# Patient Record
Sex: Female | Born: 1980
Health system: Southern US, Community
[De-identification: ages and names within clinical notes are randomized; demographics above are authoritative.]

## PROBLEM LIST (undated history)

## (undated) DIAGNOSIS — F329 Major depressive disorder, single episode, unspecified: Secondary | ICD-10-CM

## (undated) DIAGNOSIS — F32A Depression, unspecified: Secondary | ICD-10-CM

## (undated) DIAGNOSIS — R519 Headache, unspecified: Secondary | ICD-10-CM

## (undated) DIAGNOSIS — T7840XA Allergy, unspecified, initial encounter: Secondary | ICD-10-CM

## (undated) DIAGNOSIS — R51 Headache: Secondary | ICD-10-CM

## (undated) HISTORY — DX: Depression, unspecified: F32.A

## (undated) HISTORY — DX: Headache, unspecified: R51.9

## (undated) HISTORY — DX: Headache: R51

## (undated) HISTORY — DX: Major depressive disorder, single episode, unspecified: F32.9

## (undated) HISTORY — DX: Allergy, unspecified, initial encounter: T78.40XA

---
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
## 1999-02-13 ENCOUNTER — Inpatient Hospital Stay (HOSPITAL_COMMUNITY): Admission: AD | Admit: 1999-02-13 | Discharge: 1999-02-13 | Payer: Self-pay | Admitting: Obstetrics

## 1999-02-20 ENCOUNTER — Inpatient Hospital Stay (HOSPITAL_COMMUNITY): Admission: AD | Admit: 1999-02-20 | Discharge: 1999-02-20 | Payer: Self-pay | Admitting: Obstetrics

## 1999-06-15 ENCOUNTER — Inpatient Hospital Stay (HOSPITAL_COMMUNITY): Admission: AD | Admit: 1999-06-15 | Discharge: 1999-06-15 | Payer: Self-pay | Admitting: *Deleted

## 1999-12-05 ENCOUNTER — Encounter: Payer: Self-pay | Admitting: Emergency Medicine

## 1999-12-05 ENCOUNTER — Emergency Department (HOSPITAL_COMMUNITY): Admission: EM | Admit: 1999-12-05 | Discharge: 1999-12-05 | Payer: Self-pay | Admitting: Emergency Medicine

## 2000-04-28 ENCOUNTER — Emergency Department (HOSPITAL_COMMUNITY): Admission: EM | Admit: 2000-04-28 | Discharge: 2000-04-28 | Payer: Self-pay | Admitting: Emergency Medicine

## 2000-08-17 ENCOUNTER — Inpatient Hospital Stay (HOSPITAL_COMMUNITY): Admission: AD | Admit: 2000-08-17 | Discharge: 2000-08-17 | Payer: Self-pay | Admitting: Obstetrics

## 2001-09-22 ENCOUNTER — Inpatient Hospital Stay (HOSPITAL_COMMUNITY): Admission: AD | Admit: 2001-09-22 | Discharge: 2001-09-22 | Payer: Self-pay | Admitting: *Deleted

## 2003-04-29 ENCOUNTER — Emergency Department (HOSPITAL_COMMUNITY): Admission: EM | Admit: 2003-04-29 | Discharge: 2003-04-29 | Payer: Self-pay

## 2004-06-09 ENCOUNTER — Emergency Department (HOSPITAL_COMMUNITY): Admission: EM | Admit: 2004-06-09 | Discharge: 2004-06-09 | Payer: Self-pay | Admitting: Emergency Medicine

## 2005-01-14 ENCOUNTER — Inpatient Hospital Stay (HOSPITAL_COMMUNITY): Admission: AD | Admit: 2005-01-14 | Discharge: 2005-01-14 | Payer: Self-pay | Admitting: *Deleted

## 2006-09-07 IMAGING — US US OB COMP LESS 14 WK
1 series · 18 of 28 positions shown · non-contrast
Comparison: none

CLINICAL DATA: Early pregnancy.  Uncertain menstrual dates.  Bleeding.  Nausea and syncope.  
 OBSTETRICAL ULTRASOUND WITH TRANSVAGINAL:
 The uterus is retroverted.  A single living intrauterine gestation is seen with measured heart rate of 115.  Embryonic crown rump length measures 7 mm, corresponding with a gestational age of 6 weeks 4 days.  A normal appearing yolk sac is seen.  A small subchorionic hemorrhage or implantation bleed is also noted.  No other maternal uterine abnormalities are identified.
 The right ovary contains a small corpus luteum measuring approximately 2.2 x 1.7 x 1.6 cm.  The left ovary is normal in appearance.  No adnexal masses or free fluid are identified by either transabdominal or transvaginal sonography.

[Series 1: us ob comp less 14 wks · 18 of 66 slices shown]
[im 1/66]
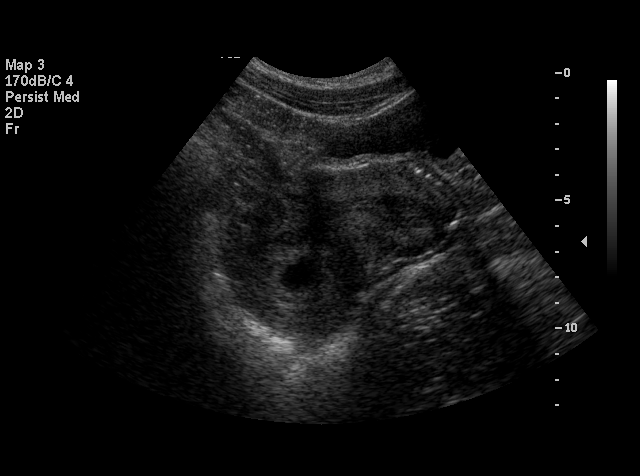
[im 5/66]
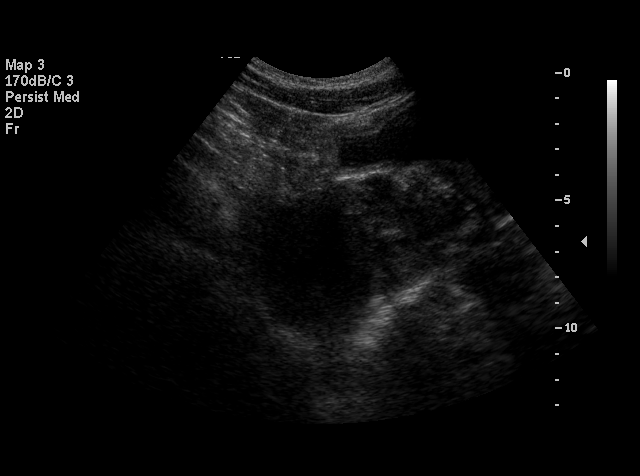
[im 8/66]
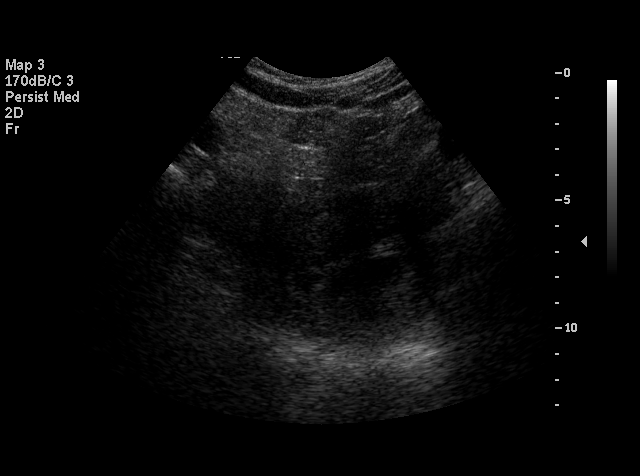
[im 13/66]
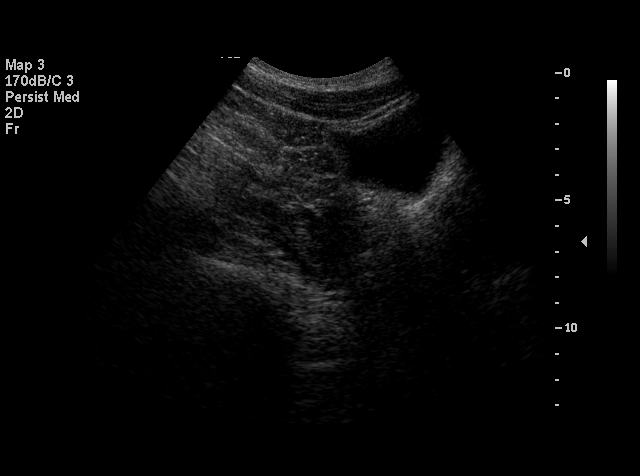
[im 17/66]
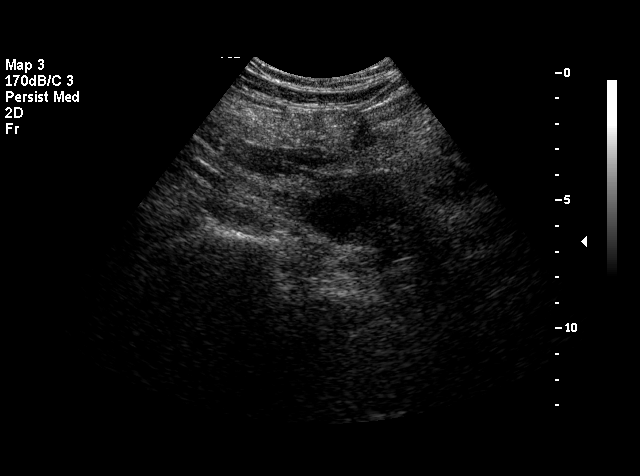
[im 20/66]
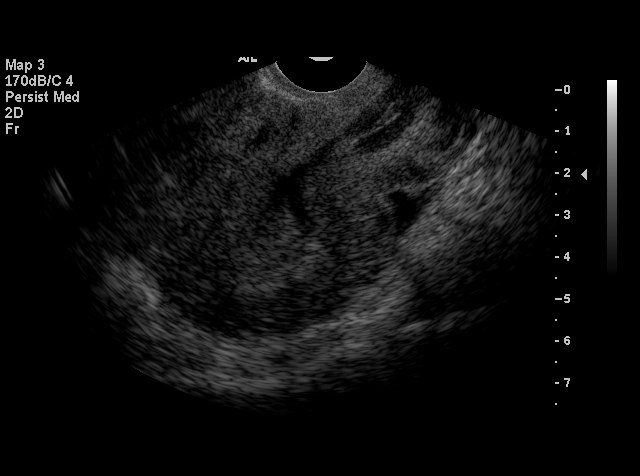
[im 25/66]
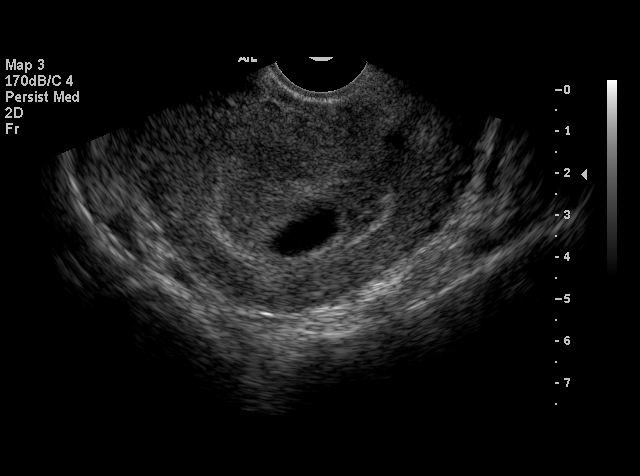
[im 27/66]
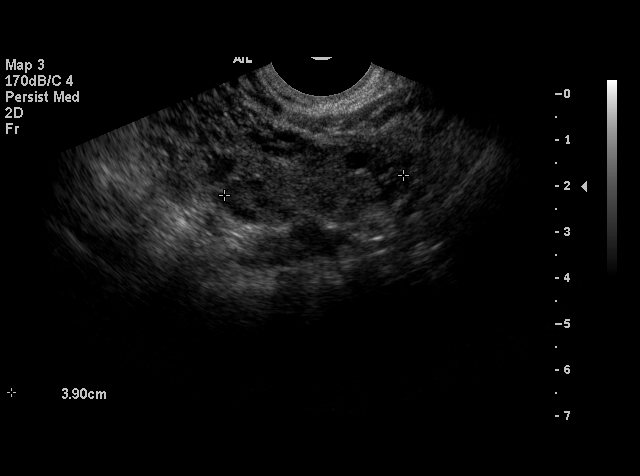
[im 32/66]
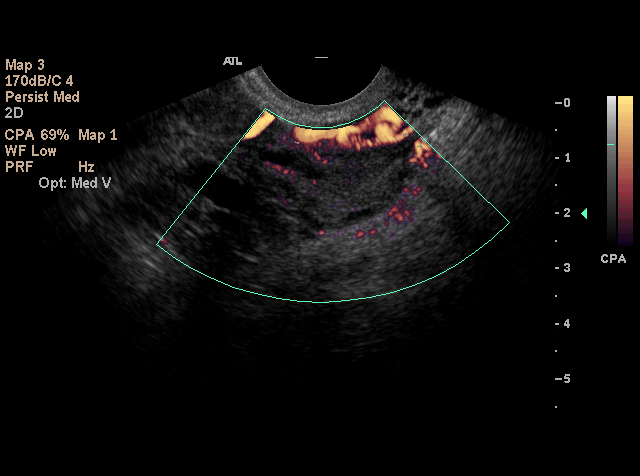
[im 34/66]
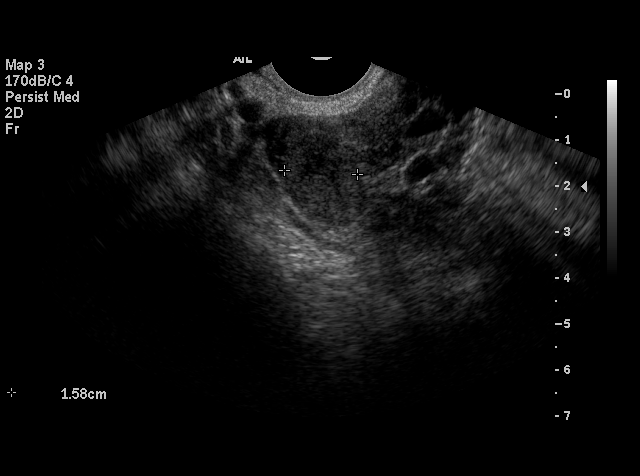
[im 39/66]
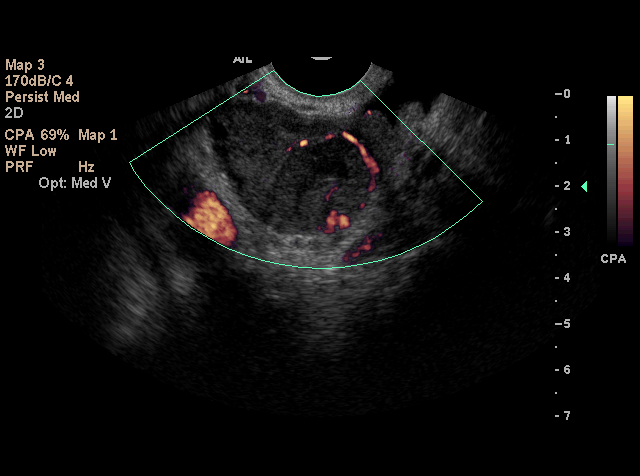
[im 41/66]
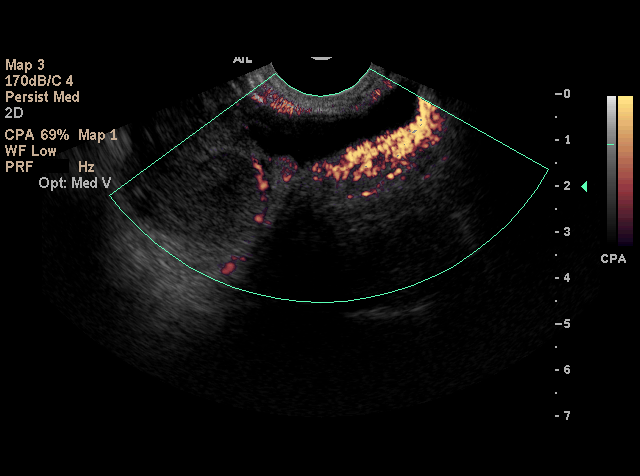
[im 46/66]
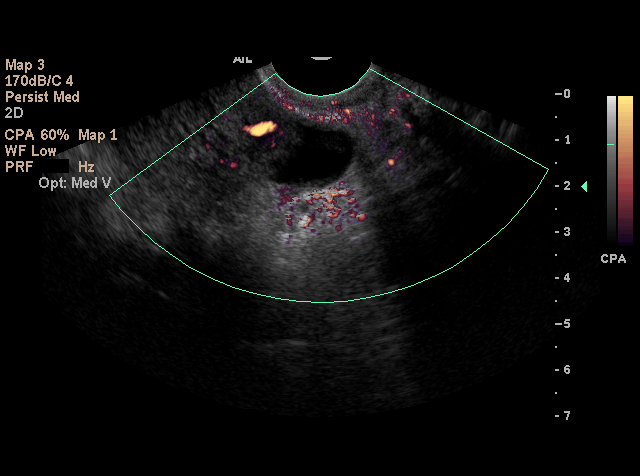
[im 51/66]
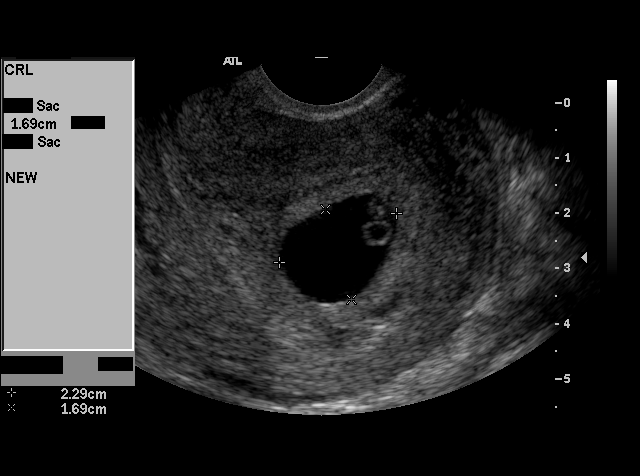
[im 53/66]
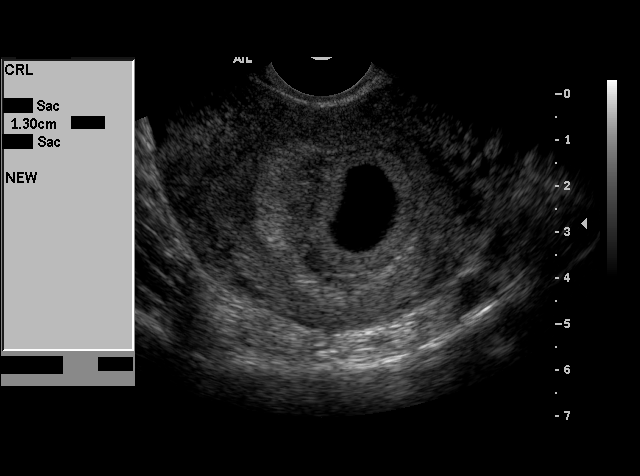
[im 58/66]
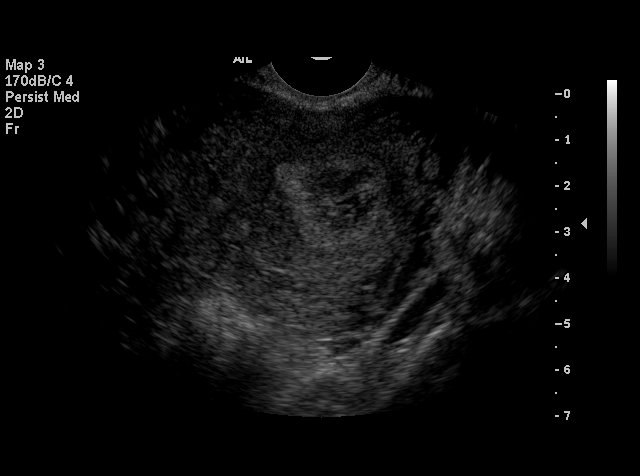
[im 61/66]
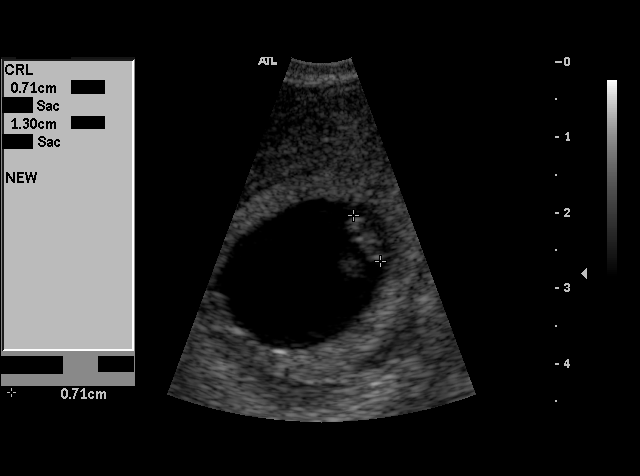
[im 66/66]
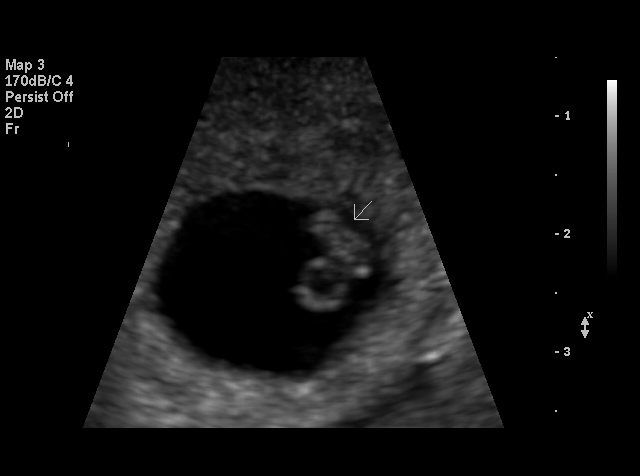

[18 of 28 positions shown; findings below may reference images not displayed]

IMPRESSION: 1.  Single living intrauterine gestation with estimated gestational age of 6 weeks 4 days and sonographic EDC of 09/05/05.  
 2.  Tiny subchorionic hemorrhage or implantation bleed.  No other significant maternal uterine or adnexal abnormality.  
 3.  Retroverted uterus noted.

## 2007-07-25 ENCOUNTER — Emergency Department (HOSPITAL_COMMUNITY): Admission: EM | Admit: 2007-07-25 | Discharge: 2007-07-25 | Payer: Self-pay | Admitting: Emergency Medicine

## 2007-08-01 ENCOUNTER — Emergency Department (HOSPITAL_COMMUNITY): Admission: EM | Admit: 2007-08-01 | Discharge: 2007-08-01 | Payer: Self-pay | Admitting: Emergency Medicine

## 2008-01-13 ENCOUNTER — Emergency Department (HOSPITAL_COMMUNITY): Admission: EM | Admit: 2008-01-13 | Discharge: 2008-01-13 | Payer: Self-pay | Admitting: Family Medicine

## 2010-08-11 ENCOUNTER — Emergency Department: Payer: Self-pay | Admitting: Emergency Medicine

## 2016-12-08 ENCOUNTER — Ambulatory Visit (INDEPENDENT_AMBULATORY_CARE_PROVIDER_SITE_OTHER): Payer: BLUE CROSS/BLUE SHIELD | Admitting: Family Medicine

## 2016-12-08 ENCOUNTER — Encounter: Payer: Self-pay | Admitting: Family Medicine

## 2016-12-08 VITALS — BP 110/70 | HR 76 | Resp 12 | Ht 65.75 in | Wt 172.1 lb

## 2016-12-08 DIAGNOSIS — R5383 Other fatigue: Secondary | ICD-10-CM | POA: Diagnosis not present

## 2016-12-08 DIAGNOSIS — F331 Major depressive disorder, recurrent, moderate: Secondary | ICD-10-CM

## 2016-12-08 LAB — COMPREHENSIVE METABOLIC PANEL
ALBUMIN: 4.1 g/dL (ref 3.5–5.2)
ALK PHOS: 45 U/L (ref 39–117)
ALT: 10 U/L (ref 0–35)
AST: 12 U/L (ref 0–37)
BILIRUBIN TOTAL: 0.4 mg/dL (ref 0.2–1.2)
BUN: 9 mg/dL (ref 6–23)
CALCIUM: 9.2 mg/dL (ref 8.4–10.5)
CO2: 25 meq/L (ref 19–32)
Chloride: 106 mEq/L (ref 96–112)
Creatinine, Ser: 0.76 mg/dL (ref 0.40–1.20)
GFR: 110.43 mL/min (ref >60.00)
Glucose, Bld: 103 mg/dL — ABNORMAL HIGH (ref 70–99)
Potassium: 4.2 mEq/L (ref 3.5–5.1)
Sodium: 137 mEq/L (ref 135–145)
TOTAL PROTEIN: 6.6 g/dL (ref 6.0–8.3)

## 2016-12-08 LAB — CBC WITH DIFFERENTIAL/PLATELET
BASOS ABS: 0.1 10*3/uL (ref 0.0–0.1)
Basophils Relative: 0.7 % (ref 0.0–3.0)
Eosinophils Absolute: 0.1 10*3/uL (ref 0.0–0.7)
Eosinophils Relative: 1.1 % (ref 0.0–5.0)
HEMATOCRIT: 37.8 % (ref 36.0–46.0)
HEMOGLOBIN: 12.5 g/dL (ref 12.0–15.0)
LYMPHS PCT: 25.2 % (ref 12.0–46.0)
Lymphs Abs: 1.9 10*3/uL (ref 0.7–4.0)
MCHC: 32.9 g/dL (ref 30.0–36.0)
MCV: 78 fl (ref 78.0–100.0)
MONOS PCT: 6.4 % (ref 3.0–12.0)
Monocytes Absolute: 0.5 10*3/uL (ref 0.1–1.0)
NEUTROS ABS: 5.1 10*3/uL (ref 1.4–7.7)
Neutrophils Relative %: 66.6 % (ref 43.0–77.0)
PLATELETS: 274 10*3/uL (ref 150.0–400.0)
RBC: 4.85 Mil/uL (ref 3.87–5.11)
RDW: 13.9 % (ref 11.5–15.5)
WBC: 7.7 10*3/uL (ref 4.0–10.5)

## 2016-12-08 LAB — TSH: TSH: 1.01 u[IU]/mL (ref 0.35–4.50)

## 2016-12-08 LAB — VITAMIN B12: Vitamin B-12: 765 pg/mL (ref 211–911)

## 2016-12-08 LAB — VITAMIN D 25 HYDROXY (VIT D DEFICIENCY, FRACTURES): VITD: 34.61 ng/mL (ref 30.00–100.00)

## 2016-12-08 MED ORDER — FLUOXETINE HCL 20 MG PO CAPS: 20.0000 mg | ORAL_CAPSULE | Freq: Every day | ORAL | 2 refills | Status: DC

## 2016-12-08 NOTE — Progress Notes (Signed)
Pre visit review using our clinic review tool, if applicable. No additional management support is needed unless otherwise documented below in the visit note. 

## 2016-12-08 NOTE — Progress Notes (Signed)
HPI:   [name] is a 36 y.o. female, who is here today to establish care with me.  Former PCP: N/A Last preventive routine visit: 2017  Chronic medical problems: Allergies,depression, and headaches among some. Depression, she was on Fluoxetine until 10 months ago, discontinued because she felt she did not longer needed it.    Concerns today: "Extreme fatigue."  "For a while" she has had fatigue, worse for the past 3 years. She has not identified exacerbating or alleviating factors.  Denies abnormal wt loss or sleep apnea. Hx of allergies, she is on Zyrtec 10 mg. LMP 11/24/16, denies heavy menses but used to until a year ago.  She started vit D 4000 U about 10 days ago and just started iron supplementation. Sleeps "good", about 10 hours, does not feel rested when she gets up.  Low motivation, denies suicidal thoughts or changes in appetite. She denies falling asleep while driving.  She denies skin rash, arthralgias,or myalgias associated.    Review of Systems  Constitutional: Positive for fatigue. Negative for activity change, appetite change, fever and unexpected weight change.  HENT: Positive for congestion and rhinorrhea. Negative for mouth sores, nosebleeds, sinus pressure, sore throat, trouble swallowing and voice change.   Eyes: Positive for itching. Negative for redness and visual disturbance.  Respiratory: Negative for cough, shortness of breath and wheezing.   Cardiovascular: Negative for chest pain, palpitations and leg swelling.  Gastrointestinal: Negative for abdominal pain, nausea and vomiting.       Negative for changes in bowel habits.  Endocrine: Negative for cold intolerance, heat intolerance, polydipsia, polyphagia and polyuria.  Genitourinary: Negative for decreased urine volume, dysuria and hematuria.  Musculoskeletal: Negative for arthralgias, gait problem and myalgias.  Skin: Negative for pallor and rash.  Allergic/Immunologic:  Positive for environmental allergies.  Neurological: Positive for headaches (2 times per week,mild,right temple,alliviated by Advil). Negative for seizures, syncope and weakness.  Hematological: Negative for adenopathy. Does not bruise/bleed easily.  Psychiatric/Behavioral: Negative for behavioral problems, hallucinations, sleep disturbance and suicidal ideas. The patient is nervous/anxious.      No current outpatient prescriptions on file prior to visit.   No current facility-administered medications on file prior to visit.     Past Medical History:  Diagnosis Date  . Allergy   . Depression   . Frequent headaches    No Known Allergies  Family History  Problem Relation Age of Onset  . Cancer Father        colon and prostate  . Alcohol abuse Mother   . Mental illness Mother   . Mental illness Son        Autism  . Cancer Paternal Grandmother        breast    Social History   Social History  . Marital status: Married    Spouse name: N/A  . Number of children: N/A  . Years of education: N/A   Social History Main Topics  . Smoking status: Never Smoker  . Smokeless tobacco: Never Used  . Alcohol use None  . Drug use: Unknown  . Sexual activity: Not Asked   Other Topics Concern  . None   Social History Narrative  . None    Vitals:   12/08/16 0802  BP: 110/70  Pulse: 76  Resp: 12   O2 sat at RA 97%. Body mass index is 27.99 kg/m.   Physical Exam  Nursing note and vitals reviewed. Constitutional: She is oriented to person, place,  and time. She appears well-developed. No distress.  HENT:  Head: Atraumatic.  Mouth/Throat: Oropharynx is clear and moist and mucous membranes are normal.  Eyes: Conjunctivae and EOM are normal. Pupils are equal, round, and reactive to light.  Neck: No tracheal deviation present. No thyroid mass and no thyromegaly present.  Cardiovascular: Normal rate and regular rhythm.   No murmur heard. Pulses:      Dorsalis pedis pulses  are 2+ on the right side, and 2+ on the left side.  Respiratory: Effort normal and breath sounds normal. No respiratory distress.  GI: Soft. She exhibits no mass. There is no hepatomegaly. There is no tenderness.  Musculoskeletal: She exhibits no edema or tenderness.  Lymphadenopathy:    She has no cervical adenopathy.  Neurological: She is alert and oriented to person, place, and time. She has normal strength. No cranial nerve deficit. Coordination and gait normal.  Skin: Skin is warm. No rash noted. No erythema.  Psychiatric: She has a normal mood and affect. Her affect is not labile. She expresses no suicidal ideation.  Well groomed, good eye contact.    ASSESSMENT AND PLAN:   Cain Sieveamikia was seen today for establish care.  Diagnoses and all orders for this visit:  Lab Results  Component Value Date   TSH 1.01 12/08/2016   Lab Results  Component Value Date   WBC 7.7 12/08/2016   HGB 12.5 12/08/2016   HCT 37.8 12/08/2016   MCV 78.0 12/08/2016   PLT 274.0 12/08/2016     Chemistry      Component Value Date/Time   NA 137 12/08/2016 0841   K 4.2 12/08/2016 0841   CL 106 12/08/2016 0841   CO2 25 12/08/2016 0841   BUN 9 12/08/2016 0841   CREATININE 0.76 12/08/2016 0841      Component Value Date/Time   CALCIUM 9.2 12/08/2016 0841   ALKPHOS 45 12/08/2016 0841   AST 12 12/08/2016 0841   ALT 10 12/08/2016 0841   BILITOT 0.4 12/08/2016 0841     Lab Results  Component Value Date   VITAMINB12 765 12/08/2016    Fatigue, unspecified type  We discussed possible causes including systemic illness, endocrinology,vit deficiencies,psychiatric, OSA among some. Healthy diet and regular physical activity may help. Further recommendations will be given according to lab results.  -     VITAMIN D 25 Hydroxy (Vit-D Deficiency, Fractures) -     Vitamin B12 -     CBC with Differential/Platelet -     Comprehensive metabolic panel -     TSH  Depression, major, recurrent, moderate  (HCC)  She tolerated Fluoxetine well, she agrees with resuming medication. Some side effects discussed. F/U in 6 weeks, before if needed.  -     FLUoxetine (PROZAC) 20 MG capsule; Take 1 capsule (20 mg total) by mouth daily.      Burak Zerbe G. [place], MD  [facility]. [place] office.

## 2016-12-08 NOTE — Patient Instructions (Signed)
A few things to remember from today's visit:   Depression, major, recurrent, moderate (HCC) - Plan: FLUoxetine (PROZAC) 20 MG capsule  Fatigue, unspecified type - Plan: VITAMIN D 25 Hydroxy (Vit-D Deficiency, Fractures), Vitamin B12, CBC with Differential/Platelet, Comprehensive metabolic panel, TSH  It is a common symptom associated with multiple factors: psychologic,medications, systemic illness, sleep disorders,infections, and unknown causes. Some work-up can be done to evaluate for common causes as thyroid disease,anemia,diabetes, or abnormalities in calcium,potassium,or sodium. Regular physical activity as tolerated and a healthy diet is usually might help and usually recommended for chronic fatigue.  Please be sure medication list is accurate. If a new problem present, please set up appointment sooner than planned today.

## 2016-12-14 ENCOUNTER — Encounter: Payer: Self-pay | Admitting: Family Medicine

## 2017-01-18 NOTE — Progress Notes (Signed)
HPI:   [name] is a 36 y.o. female, who is here today to follow on recent OV.   She was seen on 12/08/16, when she was c/o "extreme fatigue" and lack of motivation among some. Blood lab work otherwise negative: CBC,CMP,TSH,25 OH vit D,and B12. She was started on Fluoxetine 20 mg daily.   Overall she is feeling better. Fatigue has resolved, she is sleeping better.  Sleeping about 7 hours.  She denies suicidal thoughts. She is still under a lot of stress, she is not getting overwhelmed, and feels like she is dealing much better with stress.  She states that she feels more "mellow." She has not had side effects with medication.  She has no new concerns today.  Review of Systems  Constitutional: Negative for activity change, appetite change, fatigue and unexpected weight change.  Respiratory: Negative for chest tightness, shortness of breath and wheezing.   Cardiovascular: Negative for chest pain, palpitations and leg swelling.  Gastrointestinal: Negative for abdominal pain, diarrhea, nausea and vomiting.       No changes in bowel habits.  Musculoskeletal: Negative for gait problem and myalgias.  Skin: Negative for rash.  Neurological: Negative for dizziness, tremors, seizures and headaches.  Psychiatric/Behavioral: Negative for confusion, hallucinations, sleep disturbance and suicidal ideas. The patient is not nervous/anxious.       Current Outpatient Prescriptions on File Prior to Visit  Medication Sig Dispense Refill  . LORYNA 3-0.02 MG tablet Take 1 tablet by mouth daily.  11   No current facility-administered medications on file prior to visit.      Past Medical History:  Diagnosis Date  . Allergy   . Depression   . Frequent headaches    No Known Allergies  Social History   Social History  . Marital status: Married    Spouse name: N/A  . Number of children: N/A  . Years of education: N/A   Social History Main Topics  . Smoking  status: Never Smoker  . Smokeless tobacco: Never Used  . Alcohol use None  . Drug use: Unknown  . Sexual activity: Not Asked   Other Topics Concern  . None   Social History Narrative  . None    Vitals:   01/19/17 0701  BP: 100/70  Pulse: 77  Resp: 12   Body mass index is 27.52 kg/m.   Physical Exam  Nursing note and vitals reviewed. Constitutional: She is oriented to person, place, and time. She appears well-developed. No distress.  HENT:  Mouth/Throat: Oropharynx is clear and moist and mucous membranes are normal.  Eyes: Conjunctivae and EOM are normal.  Cardiovascular: Normal rate and regular rhythm.   No murmur heard. Respiratory: Effort normal and breath sounds normal. No respiratory distress.  Musculoskeletal: She exhibits no edema.  Neurological: She is alert and oriented to person, place, and time. She has normal strength. Gait normal.  Skin: Skin is warm. No erythema.  Psychiatric: She has a normal mood and affect. Her speech is normal. Cognition and memory are normal. She expresses no suicidal ideation. She expresses no suicidal plans.  Well groomed, good eye contact.     ASSESSMENT AND PLAN:     Abigail Thompson was seen today for follow-up.  Diagnoses and all orders for this visit:  Fatigue, unspecified type  Resolved after improving sleep and depression symptoms.  Depression, major, recurrent, moderate (HCC)  Improvement. Tolerating medication well, we'll review some side effects. She will continue Fluoxetine 20 mg daily.  Instructed about warning signs. Follow-up in 4 months, before if needed.  -     FLUoxetine (PROZAC) 20 MG capsule; Take 1 capsule (20 mg total) by mouth daily.      Abigail Thompson G. [place], MD  [facility]. [place] office.

## 2017-01-19 ENCOUNTER — Encounter: Payer: Self-pay | Admitting: Family Medicine

## 2017-01-19 ENCOUNTER — Ambulatory Visit (INDEPENDENT_AMBULATORY_CARE_PROVIDER_SITE_OTHER): Payer: BLUE CROSS/BLUE SHIELD | Admitting: Family Medicine

## 2017-01-19 VITALS — BP 100/70 | HR 77 | Resp 12 | Ht 65.75 in | Wt 169.2 lb

## 2017-01-19 DIAGNOSIS — R5383 Other fatigue: Secondary | ICD-10-CM | POA: Diagnosis not present

## 2017-01-19 DIAGNOSIS — F331 Major depressive disorder, recurrent, moderate: Secondary | ICD-10-CM

## 2017-01-19 MED ORDER — FLUOXETINE HCL 20 MG PO CAPS
20.0000 mg | ORAL_CAPSULE | Freq: Every day | ORAL | 1 refills | Status: DC
Start: 2017-01-19 — End: 2017-07-16

## 2017-01-19 NOTE — Patient Instructions (Signed)
A few things to remember from today's visit:   Fatigue, unspecified type  Depression, major, recurrent, moderate (HCC) - Plan: FLUoxetine (PROZAC) 20 MG capsule  No changes today.  I will see you back in 4 months.  Please be sure medication list is accurate. If a new problem present, please set up appointment sooner than planned today.

## 2017-04-23 ENCOUNTER — Encounter: Payer: Self-pay | Admitting: Family Medicine

## 2017-05-25 ENCOUNTER — Ambulatory Visit: Payer: BLUE CROSS/BLUE SHIELD | Admitting: Family Medicine

## 2017-06-29 ENCOUNTER — Ambulatory Visit (INDEPENDENT_AMBULATORY_CARE_PROVIDER_SITE_OTHER): Payer: BLUE CROSS/BLUE SHIELD | Admitting: Family Medicine

## 2017-06-29 ENCOUNTER — Encounter: Payer: Self-pay | Admitting: Family Medicine

## 2017-06-29 VITALS — BP 110/70 | HR 76 | Temp 98.6°F | Resp 16 | Ht 65.75 in | Wt 177.1 lb

## 2017-06-29 DIAGNOSIS — J069 Acute upper respiratory infection, unspecified: Secondary | ICD-10-CM

## 2017-06-29 DIAGNOSIS — J309 Allergic rhinitis, unspecified: Secondary | ICD-10-CM | POA: Diagnosis not present

## 2017-06-29 MED ORDER — BENZONATATE 100 MG PO CAPS: 200.0000 mg | ORAL_CAPSULE | Freq: Two times a day (BID) | ORAL | 0 refills | Status: AC | PRN

## 2017-06-29 MED ORDER — CETIRIZINE HCL 10 MG PO TABS: 10.0000 mg | ORAL_TABLET | Freq: Every day | ORAL | 3 refills | Status: AC

## 2017-06-29 MED ORDER — PREDNISONE 20 MG PO TABS: 40.0000 mg | ORAL_TABLET | Freq: Every day | ORAL | 0 refills | Status: AC

## 2017-06-29 MED ORDER — FLUTICASONE PROPIONATE 50 MCG/ACT NA SUSP: 1.0000 | Freq: Two times a day (BID) | NASAL | 3 refills | Status: DC

## 2017-06-29 NOTE — Patient Instructions (Addendum)
A few things to remember from today's visit:   URI, acute - Plan: fluticasone (FLONASE) 50 MCG/ACT nasal spray, cetirizine (ZYRTEC) 10 MG tablet  Allergic rhinitis, unspecified seasonality, unspecified trigger - Plan: benzonatate (TESSALON) 100 MG capsule  viral infections are self-limited and we treat each symptom depending of severity.  Over the counter medications as decongestants and cold medications usually help, they need to be taken with caution if there is a history of high blood pressure or palpitations. Tylenol and/or Ibuprofen also helps with most symptoms (headache, muscle aching, fever,etc) Plenty of fluids. Honey helps with cough. Steam inhalations helps with runny nose, nasal congestion, and may prevent sinus infections. Cough and nasal congestion could last a few days and sometimes weeks. Please follow in not any better in 1-2 weeks or if symptoms get worse.   There are 2 forms of allergic rhinitis: . Seasonal (hay fever): Caused by an allergy to pollen and/or mold spores in the air. Pollen is the fine powder that comes from the stamen of flowering plants. It can be carried through the air and is easily inhaled. Symptoms are seasonal and usually occur in spring, late summer, and fall. Marland Kitchen. Perennial: Caused by other allergens such as dust mites, pet hair or dander, or mold. Symptoms occur year-round.  Symptoms: Your symptoms can vary, depending on the severity of your allergies. Symptoms can include: Sneezing, coughing.itching (mostly eyes, nose, mouth, throat and skin),runny nose,stuffy nose.headache,pressure in the nose and cheeks,ear fullness and popping, sore throat.watery, red, or swollen eyes,dark circles under your eyes,trouble smelling, and sometimes hives.  Allergic rhinitis cannot be prevented. You can help your symptoms by avoiding the things that you are allergic, including: . Keeping windows closed. This is especially important during high-pollen seasons. . Washing  your hands after petting animals. . Using dust- and mite-proof bedding and mattress covers. . Wearing glasses outside to protect your eyes. . Showering before bed to wash off allergens from hair and skin. You can also avoid things that can make your symptoms worse, such as: . aerosol sprays . air pollution . cold temperatures . humidity . irritating fumes . tobacco smoke . wind . wood smoke.   Antihistamines help reduce the sneezing, runny nose, and itchiness of allergies. These come in pill form and as nasal sprays. Allegra,Zyrtec,or Claritin are some examples. Decongestants, such as pseudoephedrine and phenylephrine, help temporarily relieve the stuffy nose of allergies. Decongestants are found in many medicines and come as pills, nose sprays, and nose drops. They could increase heart rate and cause tachycardia and tremor. Nasal Afrin should not be used for more than 3 days because you can become dependent on them. This causes you to feel even more stopped-up when you try to quit using them.  Nasal sprays: steroids or antihistaminics. Over the counter intranasal sterids: Nasocort,Rhinocort,or Flonase.You won't notice their benefits for up to 2 weeks after starting them. Allergy shots or sublingual tablets when other treatment do not help.This is done by immunologists.   Please be sure medication list is accurate. If a new problem present, please set up appointment sooner than planned today.

## 2017-06-29 NOTE — Progress Notes (Signed)
ACUTE VISIT  HPI:  Chief Complaint  Patient presents with  . Nasal Congestion  . URI    [name] is a 36 y.o.female here today complaining of 3 days of respiratory symptoms.  Sore throat has improved. Rhinorrhea and nasal congestion are persistent.   URI   This is a new problem. The current episode started in the past 7 days. The problem has been unchanged. There has been no fever. Associated symptoms include congestion, coughing, a plugged ear sensation, rhinorrhea and a sore throat. Pertinent negatives include no abdominal pain, diarrhea, ear pain, headaches, joint pain, nausea, neck pain, rash, sinus pain, swollen glands, vomiting or wheezing. She has tried decongestant for the symptoms. The treatment provided no relief.    No Hx of recent travel. No sick contact. No known insect bite.  Hx of allergies: seasonal allergies.  Currently she is not on pharmacologic treatment.  OTC medications for this problem: Mucinex started yesterday.    Review of Systems  Constitutional: Positive for fatigue. Negative for activity change, appetite change and fever.  HENT: Positive for congestion, postnasal drip, rhinorrhea and sore throat. Negative for ear pain, mouth sores, sinus pressure, sinus pain, trouble swallowing and voice change.   Eyes: Negative for discharge, redness and itching.  Respiratory: Positive for cough. Negative for shortness of breath and wheezing.   Gastrointestinal: Negative for abdominal pain, diarrhea, nausea and vomiting.  Musculoskeletal: Negative for arthralgias, gait problem, joint pain, myalgias and neck pain.  Skin: Negative for rash.  Allergic/Immunologic: Positive for environmental allergies.  Neurological: Negative for weakness, numbness and headaches.  Hematological: Negative for adenopathy. Does not bruise/bleed easily.      Current Outpatient Medications on File Prior to Visit  Medication Sig Dispense Refill  .  FLUoxetine (PROZAC) 20 MG capsule Take 1 capsule (20 mg total) by mouth daily. 90 capsule 1  . Levonorgestrel (SKYLA) 13.5 MG IUD Skyla 14 mcg/24 hour (3 years) intrauterine device  Take 1 device by intrauterine route.     No current facility-administered medications on file prior to visit.      Past Medical History:  Diagnosis Date  . Allergy   . Depression   . Frequent headaches    No Known Allergies  Social History   Socioeconomic History  . Marital status: Married    Spouse name: None  . Number of children: None  . Years of education: None  . Highest education level: None  Social Needs  . Financial resource strain: None  . Food insecurity - worry: None  . Food insecurity - inability: None  . Transportation needs - medical: None  . Transportation needs - non-medical: None  Occupational History  . None  Tobacco Use  . Smoking status: Never Smoker  . Smokeless tobacco: Never Used  Substance and Sexual Activity  . Alcohol use: None  . Drug use: None  . Sexual activity: None  Other Topics Concern  . None  Social History Narrative  . None    Vitals:   06/29/17 0958  BP: 110/70  Pulse: 76  Resp: 16  Temp: 98.6 F (37 C)  SpO2: 97%   Body mass index is 28.81 kg/m.    Physical Exam  Nursing note and vitals reviewed. Constitutional: She is oriented to person, place, and time. She appears well-developed. She does not appear ill. No distress.  HENT:  Head: Normocephalic and atraumatic.  Right Ear: Tympanic membrane, external ear and ear canal normal.  Left Ear: Tympanic membrane, external ear and ear canal normal.  Nose: Rhinorrhea present. Right sinus exhibits no maxillary sinus tenderness and no frontal sinus tenderness. Left sinus exhibits no maxillary sinus tenderness and no frontal sinus tenderness.  Mouth/Throat: Uvula is midline and mucous membranes are normal. Posterior oropharyngeal erythema (Mild) present. No oropharyngeal exudate or posterior  oropharyngeal edema.  Nasal voice. Postnasal drainage. Hypertrophic turbinates.  Eyes: Conjunctivae are normal.  Neck: No muscular tenderness present. No edema and no erythema present.  Cardiovascular: Normal rate and regular rhythm.  No murmur heard. Respiratory: Effort normal and breath sounds normal. No stridor. No respiratory distress.  Lymphadenopathy:       Head (right side): No submandibular adenopathy present.       Head (left side): No submandibular adenopathy present.    She has no cervical adenopathy.  Neurological: She is alert and oriented to person, place, and time. She has normal strength.  Skin: Skin is warm. No rash noted. No erythema.  Psychiatric: She has a normal mood and affect. Her speech is normal.  Well groomed, good eye contact.    ASSESSMENT AND PLAN:   [name] was seen today for nasal congestion and uri.  Diagnoses and all orders for this visit:  URI, acute  Symptoms suggests a viral etiology, symptomatic treatment recommended, I do not think abx is needed at this time. Instructed to monitor for signs of complications, including new onset of fever among some, clearly instructed about warning signs. I also explained that cough and nasal congestion can last a few days and sometimes weeks. F/U as needed.  -     fluticasone (FLONASE) 50 MCG/ACT nasal spray; Place 1 spray into both nostrils 2 (two) times daily. -     cetirizine (ZYRTEC) 10 MG tablet; Take 1 tablet (10 mg total) by mouth daily. -     benzonatate (TESSALON) 100 MG capsule; Take 2 capsules (200 mg total) by mouth 2 (two) times daily as needed for up to 10 days for cough.  Allergic rhinitis, unspecified seasonality, unspecified trigger  Could be aggravating symptoms. Nasal irrigations with saline as needed. Flonase nasal spray and Zyrtec to start daily. According to patient, her health insurance will cover Zyrtec. Prednisone may help,some side effects discussed.   -     fluticasone  (FLONASE) 50 MCG/ACT nasal spray; Place 1 spray into both nostrils 2 (two) times daily. -     cetirizine (ZYRTEC) 10 MG tablet; Take 1 tablet (10 mg total) by mouth daily. -     predniSONE (DELTASONE) 20 MG tablet; Take 2 tablets (40 mg total) by mouth daily with breakfast for 3 days.     -Ms. Shealyn Jaye BeagleRenee Varney was advised to seek attention immediately if symptoms worsen or to follow if they persist or new concerns arise.       Brenlee Koskela G. [place], MD  [facility]. [place] office.

## 2017-07-02 ENCOUNTER — Encounter: Payer: Self-pay | Admitting: Family Medicine

## 2017-07-16 ENCOUNTER — Other Ambulatory Visit: Payer: Self-pay | Admitting: Family Medicine

## 2017-07-16 DIAGNOSIS — F331 Major depressive disorder, recurrent, moderate: Secondary | ICD-10-CM

## 2017-07-20 NOTE — Telephone Encounter (Signed)
Rx for Fluoxetine sent. She needs F/U appt. Thanks, BJ

## 2017-07-21 NOTE — Telephone Encounter (Signed)
Spoke with patient, informed her that Rx was sent to pharmacy. Patient stated that she has been taking them that often and that she will call and schedule follow-up appointment.

## 2018-04-22 ENCOUNTER — Ambulatory Visit (INDEPENDENT_AMBULATORY_CARE_PROVIDER_SITE_OTHER): Payer: BLUE CROSS/BLUE SHIELD | Admitting: Adult Health

## 2018-04-22 ENCOUNTER — Encounter: Payer: Self-pay | Admitting: Adult Health

## 2018-04-22 VITALS — BP 126/64 | HR 52 | Temp 98.2°F | Wt 186.9 lb

## 2018-04-22 DIAGNOSIS — W57XXXA Bitten or stung by nonvenomous insect and other nonvenomous arthropods, initial encounter: Secondary | ICD-10-CM | POA: Diagnosis not present

## 2018-04-22 DIAGNOSIS — S20462A Insect bite (nonvenomous) of left back wall of thorax, initial encounter: Secondary | ICD-10-CM | POA: Diagnosis not present

## 2018-04-22 DIAGNOSIS — S40862A Insect bite (nonvenomous) of left upper arm, initial encounter: Secondary | ICD-10-CM | POA: Diagnosis not present

## 2018-04-22 MED ORDER — METHYLPREDNISOLONE ACETATE 80 MG/ML IJ SUSP
80.0000 mg | Freq: Once | INTRAMUSCULAR | Status: AC
  Administered 2018-04-22: 80 mg via INTRAMUSCULAR

## 2018-04-22 NOTE — Progress Notes (Signed)
Subjective:    Patient ID: Abigail Thompson, female    DOB: 1981-01-18, 37 y.o.   MRN: [id number]  HPI  37 year old female who  has a past medical history of Allergy, Depression, and Frequent headaches. She presents to the office today for an acute issue of bug bites. She reports that she went for a walk on Tuesday and noticed bites on Wednesday morning. Bites and painful and itching. Bug bites are on left upper and back.   She has used Benadryl and Zyrtec without much relief   Review of Systems See HPI   Past Medical History:  Diagnosis Date  . Allergy   . Depression   . Frequent headaches     Social History   Socioeconomic History  . Marital status: Married    Spouse name: Not on file  . Number of children: Not on file  . Years of education: Not on file  . Highest education level: Not on file  Occupational History  . Not on file  Social Needs  . Financial resource strain: Not on file  . Food insecurity:    Worry: Not on file    Inability: Not on file  . Transportation needs:    Medical: Not on file    Non-medical: Not on file  Tobacco Use  . Smoking status: Never Smoker  . Smokeless tobacco: Never Used  Substance and Sexual Activity  . Alcohol use: Not on file  . Drug use: Not on file  . Sexual activity: Not on file  Lifestyle  . Physical activity:    Days per week: Not on file    Minutes per session: Not on file  . Stress: Not on file  Relationships  . Social connections:    Talks on phone: Not on file    Gets together: Not on file    Attends religious service: Not on file    Active member of club or organization: Not on file    Attends meetings of clubs or organizations: Not on file    Relationship status: Not on file  . Intimate partner violence:    Fear of current or ex partner: Not on file    Emotionally abused: Not on file    Physically abused: Not on file    Forced sexual activity: Not on file  Other Topics Concern  . Not on file    Social History Narrative  . Not on file    No past surgical history on file.  Family History  Problem Relation Age of Onset  . Cancer Father        colon and prostate  . Alcohol abuse Mother   . Mental illness Mother   . Mental illness Son        Autism  . Cancer Paternal Grandmother        breast    No Known Allergies  Current Outpatient Medications on File Prior to Visit  Medication Sig Dispense Refill  . cetirizine (ZYRTEC) 10 MG tablet Take 1 tablet (10 mg total) by mouth daily. 90 tablet 3  . FLUoxetine (PROZAC) 20 MG capsule TAKE ONE CAPSULE BY MOUTH EVERY DAY 30 capsule 0  . fluticasone (FLONASE) 50 MCG/ACT nasal spray Place 1 spray into both nostrils 2 (two) times daily. 16 g 3   No current facility-administered medications on file prior to visit.     BP 126/64 (BP Location: Left Arm, Patient Position: Sitting, Cuff Size: Normal)   Pulse (!) 52  Comment: manual  Temp 98.2 F (36.8 C) (Oral)   Wt 186 lb 14.4 oz (84.8 kg)   BMI 30.40 kg/m       Objective:   Physical Exam  Constitutional: She is oriented to person, place, and time. She appears well-developed and well-nourished. No distress.  Cardiovascular: Normal rate, regular rhythm, normal heart sounds and intact distal pulses.  Pulmonary/Chest: Effort normal and breath sounds normal.  Neurological: She is alert and oriented to person, place, and time.  Skin: Skin is warm. She is not diaphoretic. There is erythema.  6 large bug bites on left upper arm and 3 on left mid back.   Psychiatric: She has a normal mood and affect. Her behavior is normal. Judgment and thought content normal.  Nursing note and vitals reviewed.     Assessment & Plan:  1. Bug bite, initial encounter - Localized reaction to what appears as a mosquito bite. Will treat with depo injection  - methylPREDNISolone acetate (DEPO-MEDROL) injection 80 mg  Shirline Freesory Zlata Alcaide, NP

## 2018-06-02 ENCOUNTER — Ambulatory Visit (INDEPENDENT_AMBULATORY_CARE_PROVIDER_SITE_OTHER): Payer: BLUE CROSS/BLUE SHIELD | Admitting: Family Medicine

## 2018-06-02 ENCOUNTER — Encounter: Payer: Self-pay | Admitting: Family Medicine

## 2018-06-02 VITALS — BP 110/70 | HR 78 | Temp 99.1°F | Resp 12 | Wt 187.8 lb

## 2018-06-02 DIAGNOSIS — R52 Pain, unspecified: Secondary | ICD-10-CM | POA: Diagnosis not present

## 2018-06-02 DIAGNOSIS — Z23 Encounter for immunization: Secondary | ICD-10-CM

## 2018-06-02 DIAGNOSIS — J069 Acute upper respiratory infection, unspecified: Secondary | ICD-10-CM | POA: Diagnosis not present

## 2018-06-02 LAB — POC INFLUENZA A&B (BINAX/QUICKVUE)
INFLUENZA A, POC: NEGATIVE
INFLUENZA B, POC: NEGATIVE

## 2018-06-02 NOTE — Progress Notes (Signed)
ACUTE VISIT  HPI:  Chief Complaint  Patient presents with  . Generalized Body Aches    Ms.Ashly Jeimy Bickert is a 37 y.o.female here today complaining of generalized body aches, fatigue, and "head congestion" that started yesterday. She has not noted fever or chills. Negative for cough. "A little bit" of sore throatand postnasal drainage. Today she is feeling better.   HPI   No Hx of recent travel. Her husband was recently sick with similar symptoms, he did not seek medical attention. No known insect bite.  Hx of allergies: Allergy rhinitis on Zyrtec 10 mg daily.  OTC medications for this problem: Sudafed 24 hours.  She is planning on flying to [place] in a few days.   Review of Systems  Constitutional: Positive for fatigue. Negative for activity change, appetite change and fever.  HENT: Positive for congestion, postnasal drip and sore throat. Negative for ear pain, mouth sores, sinus pressure, trouble swallowing and voice change.   Eyes: Negative for discharge, redness and itching.  Respiratory: Negative for cough, shortness of breath and wheezing.   Gastrointestinal: Negative for abdominal pain, diarrhea, nausea and vomiting.  Musculoskeletal: Positive for myalgias. Negative for arthralgias.  Skin: Negative for rash.  Allergic/Immunologic: Positive for environmental allergies.  Neurological: Negative for headaches.  Hematological: Negative for adenopathy. Does not bruise/bleed easily.      Current Outpatient Medications on File Prior to Visit  Medication Sig Dispense Refill  . cetirizine (ZYRTEC) 10 MG tablet Take 1 tablet (10 mg total) by mouth daily. 90 tablet 3   No current facility-administered medications on file prior to visit.      Past Medical History:  Diagnosis Date  . Allergy   . Depression   . Frequent headaches    No Known Allergies  Social History   Socioeconomic History  . Marital status: Married    Spouse name: Not on  file  . Number of children: Not on file  . Years of education: Not on file  . Highest education level: Not on file  Occupational History  . Not on file  Social Needs  . Financial resource strain: Not on file  . Food insecurity:    Worry: Not on file    Inability: Not on file  . Transportation needs:    Medical: Not on file    Non-medical: Not on file  Tobacco Use  . Smoking status: Never Smoker  . Smokeless tobacco: Never Used  Substance and Sexual Activity  . Alcohol use: Not on file  . Drug use: Not on file  . Sexual activity: Not on file  Lifestyle  . Physical activity:    Days per week: Not on file    Minutes per session: Not on file  . Stress: Not on file  Relationships  . Social connections:    Talks on phone: Not on file    Gets together: Not on file    Attends religious service: Not on file    Active member of club or organization: Not on file    Attends meetings of clubs or organizations: Not on file    Relationship status: Not on file  Other Topics Concern  . Not on file  Social History Narrative  . Not on file    Vitals:   06/02/18 1647  BP: 110/70  Pulse: 78  Resp: 12  Temp: 99.1 F (37.3 C)  SpO2: 98%   Body mass index is 30.54 kg/m.  Physical Exam  Nursing note and vitals reviewed. Constitutional: She is oriented to person, place, and time. She appears well-developed. She does not appear ill. No distress.  HENT:  Head: Normocephalic and atraumatic.  Right Ear: Tympanic membrane, external ear and ear canal normal.  Left Ear: Tympanic membrane, external ear and ear canal normal.  Nose: Rhinorrhea present. Right sinus exhibits no maxillary sinus tenderness and no frontal sinus tenderness. Left sinus exhibits no maxillary sinus tenderness and no frontal sinus tenderness.  Mouth/Throat: Oropharynx is clear and moist and mucous membranes are normal.  Hypertrophic turbinates. Postnasal drainage.  Eyes: Conjunctivae are normal.  Cardiovascular:  Normal rate and regular rhythm.  No murmur heard. Respiratory: Effort normal and breath sounds normal. No stridor. No respiratory distress.  Lymphadenopathy:       Head (right side): No submandibular adenopathy present.       Head (left side): No submandibular adenopathy present.    She has cervical adenopathy (< 1 cm ,no tender).       Right cervical: Posterior cervical adenopathy present.       Left cervical: Posterior cervical adenopathy present.  Neurological: She is alert and oriented to person, place, and time. She has normal strength. Gait normal.  Skin: Skin is warm. No rash noted. No erythema.  Psychiatric: She has a normal mood and affect.  Well groomed, good eye contact.     ASSESSMENT AND PLAN:   Ms. Laikyn was seen today for generalized body aches.  Diagnoses and all orders for this visit:  Generalized body aches -     POC Influenza A&B(BINAX/QUICKVUE)  URI, acute  Most likely viral etiology. Because symptoms she also started she was instructed to monitor for new associated symptoms. Symptomatic treatment with acetaminophen and/or NSAIDs as well as NyQuil and DayQuil were recommended. Adequate hydration.  Recommend chewing gum and autoinflation maneuvers while flying to [place] to try to prevent earache/barotrauma.  -     POC Influenza A&B(BINAX/QUICKVUE)  Needs flu shot -     Flu Vaccine QUAD 6+ mos PF IM (Fluarix Quad PF)       Synia Douglass G. [place], MD  [facility]. [place] [facility].

## 2019-10-15 ENCOUNTER — Ambulatory Visit: Payer: Medicaid Other | Attending: Internal Medicine

## 2019-10-15 DIAGNOSIS — Z23 Encounter for immunization: Secondary | ICD-10-CM

## 2019-10-15 NOTE — Progress Notes (Signed)
   Covid-19 Vaccination Clinic  Name:  Hanaa Payes    MRN: [id number] DOB: [birth year]  10/15/2019  Ms. Bento was observed post Covid-19 immunization for 15 minutes without incident. She was provided with Vaccine Information Sheet and instruction to access the V-Safe system.   Ms. Veldman was instructed to call 911 with any severe reactions post vaccine: Marland Kitchen Difficulty breathing  . Swelling of face and throat  . A fast heartbeat  . A bad rash all over body  . Dizziness and weakness   Immunizations Administered    Name Date Dose VIS Date Route   Pfizer COVID-19 Vaccine 10/15/2019 11:40 AM 0.3 mL 07/15/2019 Intramuscular   Manufacturer: [employer], [employer]   Lot: [id number]   NDC: [address]-7

## 2019-11-07 ENCOUNTER — Ambulatory Visit: Payer: Medicaid Other

## 2019-11-07 ENCOUNTER — Ambulatory Visit: Payer: Medicaid Other | Attending: Internal Medicine

## 2019-11-07 DIAGNOSIS — Z23 Encounter for immunization: Secondary | ICD-10-CM

## 2019-11-07 NOTE — Progress Notes (Signed)
   Covid-19 Vaccination Clinic  Name:  Jazleen Robeck    MRN: [id number] DOB: 1980-10-09  11/07/2019  Ms. Tonkovich was observed post Covid-19 immunization for 15 minutes without incident. She was provided with Vaccine Information Sheet and instruction to access the V-Safe system.   Ms. Winkles was instructed to call 911 with any severe reactions post vaccine: Marland Kitchen Difficulty breathing  . Swelling of face and throat  . A fast heartbeat  . A bad rash all over body  . Dizziness and weakness   Immunizations Administered    Name Date Dose VIS Date Route   Pfizer COVID-19 Vaccine 11/07/2019 12:56 PM 0.3 mL 07/15/2019 Intramuscular   Manufacturer: [employer], [employer]   Lot: [id number]   NDC: [address]-5

## 2019-11-08 ENCOUNTER — Ambulatory Visit: Payer: Medicaid Other

## 2020-04-02 ENCOUNTER — Encounter: Payer: Self-pay | Admitting: Family Medicine

## 2020-04-02 ENCOUNTER — Ambulatory Visit (INDEPENDENT_AMBULATORY_CARE_PROVIDER_SITE_OTHER): Payer: Medicaid Other | Admitting: Family Medicine

## 2020-04-02 ENCOUNTER — Other Ambulatory Visit: Payer: Self-pay

## 2020-04-02 VITALS — BP 120/76 | HR 76 | Resp 12 | Ht 65.75 in | Wt 177.0 lb

## 2020-04-02 DIAGNOSIS — Z130 Encounter for screening for diseases of the blood and blood-forming organs and certain disorders involving the immune mechanism: Secondary | ICD-10-CM

## 2020-04-02 DIAGNOSIS — Z0000 Encounter for general adult medical examination without abnormal findings: Secondary | ICD-10-CM

## 2020-04-02 DIAGNOSIS — Z13228 Encounter for screening for other metabolic disorders: Secondary | ICD-10-CM

## 2020-04-02 DIAGNOSIS — Z1329 Encounter for screening for other suspected endocrine disorder: Secondary | ICD-10-CM

## 2020-04-02 DIAGNOSIS — Z13220 Encounter for screening for lipoid disorders: Secondary | ICD-10-CM

## 2020-04-02 DIAGNOSIS — Z1159 Encounter for screening for other viral diseases: Secondary | ICD-10-CM

## 2020-04-02 NOTE — Patient Instructions (Signed)
Today you have you routine preventive visit.  A few things to remember from today's visit:   Routine general medical examination at a health care facility  Encounter for HCV screening test for low risk patient - Plan: Hepatitis C antibody screen  Screening for lipoid disorders - Plan: Lipid panel  Screening for endocrine, metabolic and immunity disorder - Plan: Hemoglobin A1c  Release form to obtain records from gyn and immunization.  At least 150 minutes of moderate exercise per week, daily brisk walking for 15-30 min is a good exercise option. Healthy diet low in saturated (animal) fats and sweets and consisting of fresh fruits and vegetables, lean meats such as fish and white chicken and whole grains.  These are some of recommendations for screening depending of age and risk factors:  - Vaccines:  Tdap vaccine every 10 years.  Shingles vaccine recommended at age 62, could be given after 40 years of age but not sure about insurance coverage.   Pneumonia vaccines: Pneumovax at 65. Sometimes Pneumovax is giving earlier if history of smoking, lung disease,diabetes,kidney disease among some.  Screening for diabetes at age 89 and every 3 years.  Cervical cancer prevention:  Pap smear starts at 40 years of age and continues periodically until 39 years old in low risk women. Pap smear every 3 years between 103 and 8 years old. Pap smear every 3-5 years between women 30 and older if pap smear negative and HPV screening negative.   -Breast cancer: Mammogram: There is disagreement between experts about when to start screening in low risk asymptomatic female but recent recommendations are to start screening at 98 and not later than 40 years old , every 1-2 years and after 40 yo q 2 years. Screening is recommended until 40 years old but some women can continue screening depending of healthy issues.  Colon cancer screening: Has been recently changed to 40 yo. Insurance may not cover until  you are 40 years old. Screening is recommended until 40 years old.  Cholesterol disorder screening at age 67 and every 3 years.  Also recommended:  1. Dental visit- Brush and floss your teeth twice daily; visit your dentist twice a year. 2. Eye doctor- Get an eye exam at least every 2 years. 3. Helmet use- Always wear a helmet when riding a bicycle, motorcycle, rollerblading or skateboarding. 4. Safe sex- If you may be exposed to sexually transmitted infections, use a condom. 5. Seat belts- Seat belts can save your live; always wear one. 6. Smoke/Carbon Monoxide detectors- These detectors need to be installed on the appropriate level of your home. Replace batteries at least once a year. 7. Skin cancer- When out in the sun please cover up and use sunscreen 15 SPF or higher. 8. Violence- If anyone is threatening or hurting you, please tell your healthcare provider.  9. Drink alcohol in moderation- Limit alcohol intake to one drink or less per day. Never drink and drive. 10. Calcium supplementation 1000 to 1200 mg daily, ideally through your diet.  Vitamin D supplementation 800 units daily.

## 2020-04-02 NOTE — Progress Notes (Signed)
HPI: [name] is a 39 y.o. female, who is here today for her routine physical.  Last CPE: 04/20/18  Regular exercise 3 or more time per week: Not consistently. Following a healthy diet: "Horrible:, she cooks at home. She lives with her husband and son.  Chronic medical problems: Depression,allergic rhinitis, and fatigue.  Pap smear: This year and q 6 months, s/p LEEP procedure 2 years ago. Last seen by gyn 06/2019.  Immunization History  Administered Date(s) Administered   Influenza Inj Mdck Quad With Preservative 05/05/2017   Influenza,inj,Quad PF,6+ Mos 06/02/2018   Influenza-Unspecified 05/05/2015   PFIZER SARS-COV-2 Vaccination 10/15/2019, 11/07/2019   Mammogram: N/A Colonoscopy: N/A DEXA: N/A Hep C screening: Never  She has no concerns today.  Review of Systems  Constitutional: Positive for fatigue. Negative for appetite change and fever.  HENT: Negative for hearing loss, mouth sores and sore throat.   Eyes: Negative for redness and visual disturbance.  Respiratory: Negative for cough, shortness of breath and wheezing.   Cardiovascular: Negative for chest pain and leg swelling.  Gastrointestinal: Negative for abdominal pain, nausea and vomiting.       No changes in bowel habits.  Endocrine: Negative for cold intolerance, heat intolerance, polydipsia, polyphagia and polyuria.  Genitourinary: Negative for decreased urine volume, dysuria, hematuria, vaginal bleeding and vaginal discharge.  Musculoskeletal: Positive for arthralgias. Negative for gait problem and myalgias.  Skin: Negative for color change and rash.  Allergic/Immunologic: Positive for environmental allergies.  Neurological: Positive for headaches (Hx of headaches). Negative for syncope and weakness.  Hematological: Negative for adenopathy. Does not bruise/bleed easily.  Psychiatric/Behavioral: Negative for confusion and sleep disturbance. The patient is nervous/anxious.   All other  systems reviewed and are negative.  Current Outpatient Medications on File Prior to Visit  Medication Sig Dispense Refill   cetirizine (ZYRTEC) 10 MG tablet Take 1 tablet (10 mg total) by mouth daily. 90 tablet 3   SPRINTEC 28 0.25-35 MG-MCG tablet Take 1 tablet by mouth daily.     No current facility-administered medications on file prior to visit.   Past Medical History:  Diagnosis Date   Allergy    Depression    Frequent headaches    History reviewed. No pertinent surgical history.  No Known Allergies  Family History  Problem Relation Age of Onset   Cancer Father        colon and prostate   Alcohol abuse Mother    Mental illness Mother    Mental illness Son        Autism   Cancer Paternal Grandmother        breast    Social History   Socioeconomic History   Marital status: Married    Spouse name: Not on file   Number of children: Not on file   Years of education: Not on file   Highest education level: Not on file  Occupational History   Not on file  Tobacco Use   Smoking status: Never Smoker   Smokeless tobacco: Never Used  Substance and Sexual Activity   Alcohol use: Not on file   Drug use: Not on file   Sexual activity: Not on file  Other Topics Concern   Not on file  Social History Narrative   Not on file   Social Determinants of Health   Financial Resource Strain:    Difficulty of Paying Living Expenses: Not on file  Food Insecurity:    Worried About Running Out of Food in  the Last Year: Not on file   Ran Out of Food in the Last Year: Not on file  Transportation Needs:    Lack of Transportation (Medical): Not on file   Lack of Transportation (Non-Medical): Not on file  Physical Activity:    Days of Exercise per Week: Not on file   Minutes of Exercise per Session: Not on file  Stress:    Feeling of Stress : Not on file  Social Connections:    Frequency of Communication with Friends and Family: Not on file    Frequency of Social Gatherings with Friends and Family: Not on file   Attends Religious Services: Not on file   Active Member of Clubs or Organizations: Not on file   Attends Banker Meetings: Not on file   Marital Status: Not on file    Vitals:   04/02/20 1058  BP: 120/76  Pulse: 76  Resp: 12   Body mass index is 28.79 kg/m.  Wt Readings from Last 3 Encounters:  04/02/20 177 lb (80.3 kg)  06/02/18 187 lb 12.8 oz (85.2 kg)  04/22/18 186 lb 14.4 oz (84.8 kg)   Physical Exam Vitals and nursing note reviewed.  Constitutional:      General: She is not in acute distress.    Appearance: She is well-developed.  HENT:     Head: Normocephalic and atraumatic.     Right Ear: Hearing, tympanic membrane, ear canal and external ear normal.     Left Ear: Hearing, tympanic membrane, ear canal and external ear normal.     Mouth/Throat:     Pharynx: Uvula midline.  Eyes:     Conjunctiva/sclera: Conjunctivae normal.     Pupils: Pupils are equal, round, and reactive to light.  Neck:     Thyroid: No thyromegaly.     Trachea: No tracheal deviation.  Cardiovascular:     Rate and Rhythm: Normal rate and regular rhythm.     Pulses:          Dorsalis pedis pulses are 2+ on the right side and 2+ on the left side.     Heart sounds: No murmur heard.   Pulmonary:     Effort: Pulmonary effort is normal. No respiratory distress.     Breath sounds: Normal breath sounds.  Abdominal:     Palpations: Abdomen is soft. There is no hepatomegaly or mass.     Tenderness: There is no abdominal tenderness.  Genitourinary:    Comments: Deferred to gyn. Musculoskeletal:     Comments: No signs of synovitis appreciated.  Lymphadenopathy:     Cervical: No cervical adenopathy.     Upper Body:     Right upper body: No supraclavicular adenopathy.     Left upper body: No supraclavicular adenopathy.  Skin:    General: Skin is warm.     Findings: No erythema or rash.  Neurological:      Mental Status: She is alert and oriented to person, place, and time.     Cranial Nerves: No cranial nerve deficit.     Coordination: Coordination normal.     Gait: Gait normal.     Deep Tendon Reflexes:     Reflex Scores:      Bicep reflexes are 2+ on the right side and 2+ on the left side.      Patellar reflexes are 2+ on the right side and 2+ on the left side. Psychiatric:        Speech: Speech normal.  Comments: Well groomed, good eye contact.    ASSESSMENT AND PLAN:  [name] was here today annual physical examination.  Orders Placed This Encounter  Procedures   Lipid panel   Hemoglobin A1c   Hepatitis C antibody screen   Lab Results  Component Value Date   CHOL 211 (H) 04/02/2020   HDL 87 04/02/2020   LDLCALC 103 (H) 04/02/2020   TRIG 113 04/02/2020   CHOLHDL 2.4 04/02/2020   Lab Results  Component Value Date   HGBA1C 5.2 04/02/2020   Routine general medical examination at a health care facility We discussed the importance of regular physical activity and healthy diet for prevention of chronic illness and/or complications. Preventive guidelines reviewed. Vaccination up to date, she thinks she may have received Tdap at her gyn's office. Continue following with her gyn for her female preventive care. Next CPE in a year.  Encounter for HCV screening test for low risk patient -     Hepatitis C antibody screen  Screening for lipoid disorders -     Lipid panel  Screening for endocrine, metabolic and immunity disorder -     Hemoglobin A1c   Return in 1 year (on 04/02/2021) for cpe.   Kathren Scearce G. [place], MD  [facility]. [place] office.  Today you have you routine preventive visit.  A few things to remember from today's visit:   Routine general medical examination at a health care facility  Encounter for HCV screening test for low risk patient - Plan: Hepatitis C antibody screen  Screening for lipoid disorders - Plan:  Lipid panel  Screening for endocrine, metabolic and immunity disorder - Plan: Hemoglobin A1c  Release form to obtain records from gyn and immunization.  At least 150 minutes of moderate exercise per week, daily brisk walking for 15-30 min is a good exercise option. Healthy diet low in saturated (animal) fats and sweets and consisting of fresh fruits and vegetables, lean meats such as fish and white chicken and whole grains.  These are some of recommendations for screening depending of age and risk factors:  - Vaccines:  Tdap vaccine every 10 years.  Shingles vaccine recommended at age 40, could be given after 40 years of age but not sure about insurance coverage.   Pneumonia vaccines: Pneumovax at 65. Sometimes Pneumovax is giving earlier if history of smoking, lung disease,diabetes,kidney disease among some.  Screening for diabetes at age 40 and every 3 years.  Cervical cancer prevention:  Pap smear starts at 40 years of age and continues periodically until 40 years old in low risk women. Pap smear every 3 years between 1621 and 337 years old. Pap smear every 3-5 years between women 30 and older if pap smear negative and HPV screening negative.   -Breast cancer: Mammogram: There is disagreement between experts about when to start screening in low risk asymptomatic female but recent recommendations are to start screening at 7940 and not later than 40 years old , every 1-2 years and after 40 yo q 2 years. Screening is recommended until 40 years old but some women can continue screening depending of healthy issues.  Colon cancer screening: Has been recently changed to 40 yo. Insurance may not cover until you are 40 years old. Screening is recommended until 40 years old.  Cholesterol disorder screening at age 40 and every 3 years.  Also recommended:  1. Dental visit- Brush and floss your teeth twice daily; visit your dentist twice a year. 2. Eye  doctor- Get an eye exam at least every 2  years. 3. Helmet use- Always wear a helmet when riding a bicycle, motorcycle, rollerblading or skateboarding. 4. Safe sex- If you may be exposed to sexually transmitted infections, use a condom. 5. Seat belts- Seat belts can save your live; always wear one. 6. Smoke/Carbon Monoxide detectors- These detectors need to be installed on the appropriate level of your home. Replace batteries at least once a year. 7. Skin cancer- When out in the sun please cover up and use sunscreen 15 SPF or higher. 8. Violence- If anyone is threatening or hurting you, please tell your healthcare provider.  9. Drink alcohol in moderation- Limit alcohol intake to one drink or less per day. Never drink and drive. 10. Calcium supplementation 1000 to 1200 mg daily, ideally through your diet.  Vitamin D supplementation 800 units daily.

## 2020-04-03 ENCOUNTER — Encounter: Payer: Self-pay | Admitting: Family Medicine

## 2020-04-03 LAB — LIPID PANEL
Cholesterol: 211 mg/dL — ABNORMAL HIGH (ref <200)
HDL: 87 mg/dL (ref >=50)
LDL Cholesterol (Calc): 103 mg/dL (calc) — ABNORMAL HIGH
Non-HDL Cholesterol (Calc): 124 mg/dL (calc) (ref <130)
Total CHOL/HDL Ratio: 2.4 (calc) (ref <5.0)
Triglycerides: 113 mg/dL (ref <150)

## 2020-04-03 LAB — HEMOGLOBIN A1C
Hgb A1c MFr Bld: 5.2 % of total Hgb (ref <5.7)
Mean Plasma Glucose: 103 (calc)
eAG (mmol/L): 5.7 (calc)

## 2020-04-03 LAB — HEPATITIS C ANTIBODY
Hepatitis C Ab: NONREACTIVE
SIGNAL TO CUT-OFF: 0.01 (ref <1.00)

## 2020-04-04 ENCOUNTER — Other Ambulatory Visit: Payer: Self-pay | Admitting: Family Medicine

## 2020-04-04 MED ORDER — FLUOXETINE HCL 20 MG PO CAPS: 20.0000 mg | ORAL_CAPSULE | Freq: Every day | ORAL | 0 refills | Status: DC

## 2020-04-05 ENCOUNTER — Encounter: Payer: Self-pay | Admitting: Family Medicine

## 2020-04-27 ENCOUNTER — Other Ambulatory Visit: Payer: Self-pay | Admitting: Family Medicine

## 2020-05-22 ENCOUNTER — Other Ambulatory Visit: Payer: Self-pay | Admitting: Family Medicine

## 2020-07-30 ENCOUNTER — Other Ambulatory Visit: Payer: Self-pay | Admitting: Family Medicine

## 2023-01-06 ENCOUNTER — Other Ambulatory Visit: Payer: Self-pay | Admitting: Nurse Practitioner

## 2023-01-06 DIAGNOSIS — N390 Urinary tract infection, site not specified: Secondary | ICD-10-CM

## 2023-01-06 MED ORDER — PHENAZOPYRIDINE HCL 200 MG PO TABS: 200.0000 mg | ORAL_TABLET | Freq: Three times a day (TID) | ORAL | 0 refills | Status: AC | PRN

## 2023-01-06 MED ORDER — NITROFURANTOIN MONOHYD MACRO 100 MG PO CAPS: 100.0000 mg | ORAL_CAPSULE | Freq: Two times a day (BID) | ORAL | 0 refills | Status: AC

## 2023-01-06 MED ORDER — FLUCONAZOLE 150 MG PO TABS: 150.0000 mg | ORAL_TABLET | Freq: Every day | ORAL | 0 refills | Status: AC

## 2023-06-24 ENCOUNTER — Other Ambulatory Visit: Payer: Self-pay

## 2023-06-24 ENCOUNTER — Emergency Department (HOSPITAL_BASED_OUTPATIENT_CLINIC_OR_DEPARTMENT_OTHER): Payer: Self-pay | Admitting: Radiology

## 2023-06-24 ENCOUNTER — Encounter (HOSPITAL_BASED_OUTPATIENT_CLINIC_OR_DEPARTMENT_OTHER): Payer: Self-pay

## 2023-06-24 ENCOUNTER — Emergency Department (HOSPITAL_BASED_OUTPATIENT_CLINIC_OR_DEPARTMENT_OTHER)
Admission: EM | Admit: 2023-06-24 | Discharge: 2023-06-24 | Disposition: A | Discharge status: Home / Self Care | Payer: Self-pay | Attending: Emergency Medicine | Admitting: Emergency Medicine

## 2023-06-24 DIAGNOSIS — M25511 Pain in right shoulder: Secondary | ICD-10-CM | POA: Insufficient documentation

## 2023-06-24 DIAGNOSIS — S20219A Contusion of unspecified front wall of thorax, initial encounter: Secondary | ICD-10-CM

## 2023-06-24 DIAGNOSIS — Y92410 Unspecified street and highway as the place of occurrence of the external cause: Secondary | ICD-10-CM | POA: Diagnosis not present

## 2023-06-24 DIAGNOSIS — T148XXA Other injury of unspecified body region, initial encounter: Secondary | ICD-10-CM

## 2023-06-24 DIAGNOSIS — R079 Chest pain, unspecified: Secondary | ICD-10-CM | POA: Diagnosis present

## 2023-06-24 LAB — BASIC METABOLIC PANEL
Anion gap: 7 (ref 5–15)
BUN: 11 mg/dL (ref 6–20)
CO2: 25 mmol/L (ref 22–32)
Calcium: 9.3 mg/dL (ref 8.9–10.3)
Chloride: 106 mmol/L (ref 98–111)
Creatinine, Ser: 0.78 mg/dL (ref 0.44–1.00)
GFR, Estimated: >60 mL/min (ref >60)
Glucose, Bld: 70 mg/dL (ref 70–99)
Potassium: 3.5 mmol/L (ref 3.5–5.1)
Sodium: 138 mmol/L (ref 135–145)

## 2023-06-24 LAB — CBC
HCT: 38.5 % (ref 36.0–46.0)
Hemoglobin: 12.3 g/dL (ref 12.0–15.0)
MCH: 25.3 pg — ABNORMAL LOW (ref 26.0–34.0)
MCHC: 31.9 g/dL (ref 30.0–36.0)
MCV: 79.1 fL — ABNORMAL LOW (ref 80.0–100.0)
Platelets: 331 10*3/uL (ref 150–400)
RBC: 4.87 MIL/uL (ref 3.87–5.11)
RDW: 14.1 % (ref 11.5–15.5)
WBC: 10.4 10*3/uL (ref 4.0–10.5)
nRBC: 0 % (ref 0.0–0.2)

## 2023-06-24 LAB — TROPONIN I (HIGH SENSITIVITY): Troponin I (High Sensitivity): 3 ng/L (ref <18)

## 2023-06-24 LAB — PREGNANCY, URINE: Preg Test, Ur: NEGATIVE

## 2023-06-24 MED ORDER — CYCLOBENZAPRINE HCL 10 MG PO TABS: 10.0000 mg | ORAL_TABLET | Freq: Two times a day (BID) | ORAL | 0 refills | Status: AC | PRN

## 2023-06-24 MED ORDER — IBUPROFEN 400 MG PO TABS
400.0000 mg | ORAL_TABLET | Freq: Once | ORAL | Status: AC
  Administered 2023-06-24: 400 mg via ORAL
  Filled 2023-06-24: qty 1

## 2023-06-24 MED ORDER — CYCLOBENZAPRINE HCL 10 MG PO TABS
10.0000 mg | ORAL_TABLET | Freq: Once | ORAL | Status: AC
Start: 2023-06-24 — End: 2023-06-24
  Administered 2023-06-24: 10 mg via ORAL
  Filled 2023-06-24: qty 1
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
## 2023-06-24 NOTE — Discharge Instructions (Addendum)
All the x-rays look normal today.  However you will be very sore over the next few days.  You can take 2 Tylenol and 2 ibuprofen together every 6 hours for the pain but you can also use the muscle relaxer if you need for stiffness and achiness.  The muscle relaxer could make you feel drowsy so do not take it and drive

## 2023-06-24 NOTE — ED Notes (Signed)
 RN reviewed discharge instructions with pt. Pt verbalized understanding and had no further questions. VSS upon discharge.  
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
## 2023-09-02 ENCOUNTER — Encounter: Payer: Self-pay | Admitting: Nurse Practitioner

## 2023-09-02 MED ORDER — ERYTHROMYCIN 5 MG/GM OP OINT: 1.0000 | TOPICAL_OINTMENT | Freq: Four times a day (QID) | OPHTHALMIC | 0 refills | Status: AC

## 2023-09-06 ENCOUNTER — Other Ambulatory Visit: Payer: Self-pay | Admitting: Nurse Practitioner

## 2023-09-06 MED ORDER — PREDNISOLONE ACETATE 1 % OP SUSP: 1.0000 [drp] | Freq: Four times a day (QID) | OPHTHALMIC | 0 refills | Status: DC

## 2024-07-21 ENCOUNTER — Other Ambulatory Visit: Payer: Self-pay | Admitting: Nurse Practitioner

## 2024-07-21 MED ORDER — PREDNISOLONE ACETATE 1 % OP SUSP: 1.0000 [drp] | Freq: Four times a day (QID) | OPHTHALMIC | 0 refills | Status: AC
